# Patient Record
Sex: Male | Born: 2000 | Race: White | Hispanic: No | Marital: Single | Smoking: Never smoker
Health system: Southern US, Community
[De-identification: ages and names within clinical notes are randomized; demographics above are authoritative.]

## PROBLEM LIST (undated history)

## (undated) DIAGNOSIS — N200 Calculus of kidney: Secondary | ICD-10-CM

## (undated) DIAGNOSIS — F419 Anxiety disorder, unspecified: Secondary | ICD-10-CM

## (undated) DIAGNOSIS — F32A Depression, unspecified: Secondary | ICD-10-CM

## (undated) DIAGNOSIS — L409 Psoriasis, unspecified: Secondary | ICD-10-CM

## (undated) HISTORY — DX: Anxiety disorder, unspecified: F41.9

## (undated) HISTORY — DX: Depression, unspecified: F32.A

---

## 2020-01-27 ENCOUNTER — Ambulatory Visit: Payer: Self-pay | Attending: Internal Medicine

## 2020-01-27 DIAGNOSIS — Z23 Encounter for immunization: Secondary | ICD-10-CM

## 2020-01-27 NOTE — Progress Notes (Signed)
   Covid-19 Vaccination Clinic  Name:  Tia Gelb    MRN: [id number] DOB: August 21, 2001  01/27/2020  Mr. Parcell was observed post Covid-19 immunization for 15 minutes without incident. He was provided with Vaccine Information Sheet and instruction to access the V-Safe system.   Mr. Hamman was instructed to call 911 with any severe reactions post vaccine: Marland Kitchen Difficulty breathing  . Swelling of face and throat  . A fast heartbeat  . A bad rash all over body  . Dizziness and weakness   Immunizations Administered    Name Date Dose VIS Date Route   Pfizer COVID-19 Vaccine 01/27/2020  4:39 PM 0.3 mL 10/11/2019 Intramuscular   Manufacturer: [employer], [employer]   Lot: [id number]   NDC: [address]-3

## 2020-02-06 ENCOUNTER — Emergency Department: Payer: 59

## 2020-02-06 ENCOUNTER — Other Ambulatory Visit: Payer: Self-pay | Admitting: Urology

## 2020-02-06 ENCOUNTER — Other Ambulatory Visit: Payer: Self-pay

## 2020-02-06 ENCOUNTER — Encounter
Admission: EM | Disposition: A | Discharge status: Home / Self Care | Payer: Self-pay | Source: Home / Self Care | Attending: Emergency Medicine

## 2020-02-06 ENCOUNTER — Ambulatory Visit (INDEPENDENT_AMBULATORY_CARE_PROVIDER_SITE_OTHER): Payer: Self-pay | Admitting: Urology

## 2020-02-06 ENCOUNTER — Encounter: Payer: Self-pay | Admitting: Emergency Medicine

## 2020-02-06 ENCOUNTER — Observation Stay
Admission: EM | Admit: 2020-02-06 | Discharge: 2020-02-06 | Disposition: A | Discharge status: Home / Self Care | Payer: 59 | Attending: Urology | Admitting: Urology

## 2020-02-06 DIAGNOSIS — Z79899 Other long term (current) drug therapy: Secondary | ICD-10-CM | POA: Diagnosis not present

## 2020-02-06 DIAGNOSIS — Z87442 Personal history of urinary calculi: Secondary | ICD-10-CM | POA: Diagnosis not present

## 2020-02-06 DIAGNOSIS — N135 Crossing vessel and stricture of ureter without hydronephrosis: Secondary | ICD-10-CM

## 2020-02-06 DIAGNOSIS — N201 Calculus of ureter: Secondary | ICD-10-CM | POA: Diagnosis present

## 2020-02-06 DIAGNOSIS — N200 Calculus of kidney: Secondary | ICD-10-CM

## 2020-02-06 DIAGNOSIS — Z20822 Contact with and (suspected) exposure to covid-19: Secondary | ICD-10-CM | POA: Insufficient documentation

## 2020-02-06 DIAGNOSIS — N132 Hydronephrosis with renal and ureteral calculous obstruction: Principal | ICD-10-CM | POA: Insufficient documentation

## 2020-02-06 DIAGNOSIS — Z6828 Body mass index (BMI) 28.0-28.9, adult: Secondary | ICD-10-CM | POA: Diagnosis not present

## 2020-02-06 HISTORY — DX: Calculus of kidney: N20.0

## 2020-02-06 HISTORY — DX: Psoriasis, unspecified: L40.9

## 2020-02-06 HISTORY — PX: EXTRACORPOREAL SHOCK WAVE LITHOTRIPSY: SHX1557

## 2020-02-06 LAB — COMPREHENSIVE METABOLIC PANEL
ALT: 73 U/L — ABNORMAL HIGH (ref 0–44)
AST: 42 U/L — ABNORMAL HIGH (ref 15–41)
Albumin: 4.3 g/dL (ref 3.5–5.0)
Alkaline Phosphatase: 66 U/L (ref 38–126)
Anion gap: 9 (ref 5–15)
BUN: 13 mg/dL (ref 6–20)
CO2: 24 mmol/L (ref 22–32)
Calcium: 9 mg/dL (ref 8.9–10.3)
Chloride: 105 mmol/L (ref 98–111)
Creatinine, Ser: 0.77 mg/dL (ref 0.61–1.24)
GFR calc Af Amer: >60 mL/min (ref >60)
GFR calc non Af Amer: >60 mL/min (ref >60)
Glucose, Bld: 98 mg/dL (ref 70–99)
Potassium: 3.9 mmol/L (ref 3.5–5.1)
Sodium: 138 mmol/L (ref 135–145)
Total Bilirubin: 0.9 mg/dL (ref 0.3–1.2)
Total Protein: 7.5 g/dL (ref 6.5–8.1)

## 2020-02-06 LAB — CBC
HCT: 44.5 % (ref 39.0–52.0)
Hemoglobin: 14.7 g/dL (ref 13.0–17.0)
MCH: 27.8 pg (ref 26.0–34.0)
MCHC: 33 g/dL (ref 30.0–36.0)
MCV: 84.1 fL (ref 80.0–100.0)
Platelets: 173 10*3/uL (ref 150–400)
RBC: 5.29 MIL/uL (ref 4.22–5.81)
RDW: 12.5 % (ref 11.5–15.5)
WBC: 6.1 10*3/uL (ref 4.0–10.5)
nRBC: 0 % (ref 0.0–0.2)

## 2020-02-06 LAB — URINALYSIS, COMPLETE (UACMP) WITH MICROSCOPIC
Bacteria, UA: NONE SEEN
Bilirubin Urine: NEGATIVE
Glucose, UA: NEGATIVE mg/dL
Ketones, ur: NEGATIVE mg/dL
Nitrite: NEGATIVE
Protein, ur: 100 mg/dL — AB
RBC / HPF: >50 RBC/hpf — ABNORMAL HIGH (ref 0–5)
Specific Gravity, Urine: 1.025 (ref 1.005–1.030)
Squamous Epithelial / HPF: NONE SEEN (ref 0–5)
pH: 5 (ref 5.0–8.0)

## 2020-02-06 LAB — RESPIRATORY PANEL BY RT PCR (FLU A&B, COVID)
Influenza A by PCR: NEGATIVE
Influenza B by PCR: NEGATIVE
SARS Coronavirus 2 by RT PCR: NEGATIVE

## 2020-02-06 LAB — LIPASE, BLOOD: Lipase: 21 U/L (ref 11–51)

## 2020-02-06 SURGERY — LITHOTRIPSY, ESWL
Anesthesia: Moderate Sedation | Laterality: Left

## 2020-02-06 MED ORDER — DIPHENHYDRAMINE HCL 25 MG PO CAPS
25.0000 mg | ORAL_CAPSULE | ORAL | Status: AC
  Administered 2020-02-06: 16:00:00 25 mg via ORAL

## 2020-02-06 MED ORDER — ONDANSETRON HCL 4 MG/2ML IJ SOLN
INTRAMUSCULAR | Status: AC
  Administered 2020-02-06: 17:00:00 4 mg via INTRAVENOUS
  Filled 2020-02-06: qty 2

## 2020-02-06 MED ORDER — ONDANSETRON HCL 4 MG/2ML IJ SOLN: 4.0000 mg | Freq: Once | INTRAMUSCULAR | Status: AC

## 2020-02-06 MED ORDER — FENTANYL CITRATE (PF) 100 MCG/2ML IJ SOLN
50.0000 ug | INTRAMUSCULAR | Status: DC | PRN
  Administered 2020-02-06: 12:00:00 50 ug via INTRAVENOUS
  Filled 2020-02-06: qty 2

## 2020-02-06 MED ORDER — CIPROFLOXACIN HCL 500 MG PO TABS
500.0000 mg | ORAL_TABLET | ORAL | Status: AC
  Administered 2020-02-06: 17:00:00 500 mg via ORAL

## 2020-02-06 MED ORDER — TAMSULOSIN HCL 0.4 MG PO CAPS: 0.4000 mg | ORAL_CAPSULE | Freq: Every day | ORAL | 0 refills | Status: AC

## 2020-02-06 MED ORDER — CIPROFLOXACIN HCL 500 MG PO TABS
ORAL_TABLET | ORAL | Status: AC
  Filled 2020-02-06: qty 1

## 2020-02-06 MED ORDER — HYDROMORPHONE HCL 1 MG/ML IJ SOLN
1.0000 mg | Freq: Once | INTRAMUSCULAR | Status: DC
  Filled 2020-02-06: qty 1

## 2020-02-06 MED ORDER — DIPHENHYDRAMINE HCL 25 MG PO CAPS
ORAL_CAPSULE | ORAL | Status: AC
  Filled 2020-02-06: qty 1

## 2020-02-06 MED ORDER — MORPHINE SULFATE (PF) 2 MG/ML IV SOLN
2.0000 mg | Freq: Once | INTRAVENOUS | Status: DC
  Filled 2020-02-06: qty 1

## 2020-02-06 MED ORDER — HYDROCODONE-ACETAMINOPHEN 5-325 MG PO TABS: 1.0000 | ORAL_TABLET | Freq: Four times a day (QID) | ORAL | 0 refills | Status: AC | PRN

## 2020-02-06 MED ORDER — MORPHINE SULFATE (PF) 2 MG/ML IV SOLN
2.0000 mg | Freq: Once | INTRAVENOUS | Status: AC
  Administered 2020-02-06: 2 mg via INTRAVENOUS

## 2020-02-06 MED ORDER — DIAZEPAM 5 MG PO TABS
10.0000 mg | ORAL_TABLET | ORAL | Status: AC
  Administered 2020-02-06: 10 mg via ORAL

## 2020-02-06 MED ORDER — ONDANSETRON HCL 4 MG/2ML IJ SOLN
4.0000 mg | Freq: Once | INTRAMUSCULAR | Status: AC
  Administered 2020-02-06: 4 mg via INTRAVENOUS
  Filled 2020-02-06: qty 2

## 2020-02-06 MED ORDER — SODIUM CHLORIDE 0.9 % IV SOLN
INTRAVENOUS | Status: DC
  Administered 2020-02-06: 100 mL/h via INTRAVENOUS

## 2020-02-06 MED ORDER — KETOROLAC TROMETHAMINE 30 MG/ML IJ SOLN
30.0000 mg | Freq: Once | INTRAMUSCULAR | Status: AC
  Administered 2020-02-06: 15:00:00 30 mg via INTRAVENOUS
  Filled 2020-02-06: qty 1

## 2020-02-06 MED ORDER — HYDROMORPHONE HCL 1 MG/ML IJ SOLN
INTRAMUSCULAR | Status: AC
  Administered 2020-02-06: 16:00:00 1 mg via INTRAVENOUS
  Filled 2020-02-06: qty 1

## 2020-02-06 MED ORDER — DIAZEPAM 5 MG PO TABS
ORAL_TABLET | ORAL | Status: AC
  Filled 2020-02-06: qty 2

## 2020-02-06 MED ORDER — MORPHINE SULFATE (PF) 2 MG/ML IV SOLN
2.0000 mg | Freq: Once | INTRAVENOUS | Status: AC
  Administered 2020-02-06: 14:00:00 2 mg via INTRAVENOUS
  Filled 2020-02-06: qty 1

## 2020-02-06 NOTE — ED Notes (Signed)
Up to bathroom  States he is still having pain but it has eased off slightly

## 2020-02-06 NOTE — ED Notes (Signed)
Report called to Cindy RN.

## 2020-02-06 NOTE — ED Triage Notes (Signed)
Pt here for left side/mid abdominal pain. Hx of kidney stone, feel similar.  Tried to call doctor back in [place] to get flomax but hasn't heard back.  No vomiting. No fever.

## 2020-02-06 NOTE — ED Provider Notes (Signed)
[facility] Emergency Department Provider Note  ____________________________________________  Time seen: Approximately 11:58 AM  I have reviewed the triage vital signs and the nursing notes.   HISTORY  Chief Complaint Abdominal Pain    HPI Ronald Little is a 19 y.o. male that presents to the emergency department for evaluation of left-sided mid back pain today.  Pain radiates into his left side.  States that he had his first kidney stone when he was 77.  He is concerned that he is having a "attack." His mother called urology this morning, who scheduled him an appointment for 130 but said it was okay if he came to the emergency department.  He has a urologist in [place] and called them this morning as well but they have not returned his call.  No fevers, nausea, vomiting, dysuria.  Past Medical History:  Diagnosis Date  . Kidney stone   . Psoriasis     There are no problems to display for this patient.   History reviewed. No pertinent surgical history.  Prior to Admission medications   Not on File    Allergies Patient has no known allergies.  History reviewed. No pertinent family history.  Social History Social History   Tobacco Use  . Smoking status: Never Smoker  . Smokeless tobacco: Never Used  Substance Use Topics  . Alcohol use: Never  . Drug use: Yes    Types: Marijuana     Review of Systems  Constitutional: No fever/chills Cardiovascular: No chest pain. Respiratory: No cough. No SOB. Gastrointestinal: No abdominal pain.  No nausea, no vomiting.  Genitourinary: Negative for dysuria. Musculoskeletal: Positive for left-sided back pain. Skin: Negative for rash, abrasions, lacerations, ecchymosis. Neurological: Negative for headaches   ____________________________________________   PHYSICAL EXAM:  VITAL SIGNS: ED Triage Vitals  Enc Vitals Group     BP 02/06/20 0939 (!) 158/95     Pulse Rate 02/06/20 0939 80     Resp 02/06/20  0939 16     Temp 02/06/20 0939 98.2 F (36.8 C)     Temp Source 02/06/20 0939 Oral     SpO2 02/06/20 0939 99 %     Weight 02/06/20 0940 225 lb (102.1 kg)     Height 02/06/20 0940 6\' 2"  (1.88 m)     Head Circumference --      Peak Flow --      Pain Score 02/06/20 0940 5     Pain Loc --      Pain Edu? --      Excl. in [place]? --      Constitutional: Alert and oriented. Well appearing and in no acute distress. Eyes: Conjunctivae are normal. PERRL. EOMI. Head: Atraumatic. ENT:      Ears:      Nose: No congestion/rhinnorhea.      Mouth/Throat: Mucous membranes are moist.  Neck: No stridor. Cardiovascular: Normal rate, regular rhythm.  Good peripheral circulation. Respiratory: Normal respiratory effort without tachypnea or retractions. Lungs CTAB. Good air entry to the bases with no decreased or absent breath sounds. Gastrointestinal: Bowel sounds 4 quadrants. Soft and nontender to palpation. No guarding or rigidity. No palpable masses. No distention. Musculoskeletal: Full range of motion to all extremities. No gross deformities appreciated. Neurologic:  Normal speech and language. No gross focal neurologic deficits are appreciated.  Skin:  Skin is warm, dry and intact. No rash noted. Psychiatric: Mood and affect are normal. Speech and behavior are normal. Patient exhibits appropriate insight and judgement.  ____________________________________________   LABS (all labs ordered are listed, but only abnormal results are displayed)  Labs Reviewed  COMPREHENSIVE METABOLIC PANEL - Abnormal; Notable for the following components:      Result Value   AST 42 (*)    ALT 73 (*)    All other components within normal limits  URINALYSIS, COMPLETE (UACMP) WITH MICROSCOPIC - Abnormal; Notable for the following components:   Color, Urine YELLOW (*)    APPearance HAZY (*)    Hgb urine dipstick LARGE (*)    Protein, ur 100 (*)    Leukocytes,Ua TRACE (*)    RBC / HPF >50 (*)    All other  components within normal limits  RESPIRATORY PANEL BY RT PCR (FLU A&B, COVID)  LIPASE, BLOOD  CBC   ____________________________________________  EKG   ____________________________________________  RADIOLOGY   CT Renal Stone Study  Result Date: 02/06/2020 CLINICAL DATA:  Left flank pain for 2 days. EXAM: CT ABDOMEN AND PELVIS WITHOUT CONTRAST TECHNIQUE: Multidetector CT imaging of the abdomen and pelvis was performed following the standard protocol without IV contrast. COMPARISON:  None. FINDINGS: Lower chest: Insert lung bases Hepatobiliary: No focal hepatic lesions or intrahepatic biliary dilatation. The gallbladder is normal. No common bile duct dilatation. Pancreas: No mass, inflammation or ductal dilatation. Spleen: Normal size. No focal lesions. Adrenals/Urinary Tract: The adrenal glands are normal. Small left renal calculi are noted. I do not see any definite right-sided renal calculi. There is moderate to marked left-sided hydronephrosis and marked left hydroureter all the way down to an obstructing 11.5 x 8.0 mm distal ureteral calculus near the UVJ. No right-sided ureteral calculi. No bladder calculi. No worrisome renal or bladder lesions are identified without contrast. Stomach/Bowel: The stomach, duodenum, small bowel and colon are grossly normal without oral contrast. No inflammatory changes, mass lesions or obstructive findings. The appendix is normal. Vascular/Lymphatic: The aorta is normal in caliber. No atheroscerlotic calcifications. No mesenteric of retroperitoneal mass or adenopathy. Small scattered lymph nodes are noted. Reproductive: The prostate gland and seminal vesicles are unremarkable. Other: No pelvic mass or adenopathy. No free pelvic fluid collections. No inguinal mass or adenopathy. No abdominal wall hernia or subcutaneous lesions. Musculoskeletal: No significant bony findings. IMPRESSION: 1. 11.5 x 8.0 mm distal left ureteral calculus causing moderate to marked  left-sided hydronephrosis and marked left hydroureter. 2. Small left renal calculi. 3. No worrisome renal or bladder lesions without contrast. 4. No other significant or acute abdominal/pelvic findings. Electronically Signed   By: Rudie Meyer M.D.   On: 02/06/2020 12:24    ____________________________________________    PROCEDURES  Procedure(s) performed:    Procedures    Medications  fentaNYL (SUBLIMAZE) injection 50 mcg (50 mcg Intravenous Given 02/06/20 1153)  ondansetron (ZOFRAN) injection 4 mg (4 mg Intravenous Given 02/06/20 1153)  morphine 2 MG/ML injection 2 mg (2 mg Intravenous Given 02/06/20 1223)  morphine 2 MG/ML injection 2 mg (2 mg Intravenous Given 02/06/20 1402)  ketorolac (TORADOL) 30 MG/ML injection 30 mg (30 mg Intravenous Given 02/06/20 1500)     ____________________________________________   INITIAL IMPRESSION / ASSESSMENT AND PLAN / ED COURSE  Pertinent labs & imaging results that were available during my care of the patient were reviewed by me and considered in my medical decision making (see chart for details).  Review of the  CSRS was performed in accordance of the [facility] prior to dispensing any controlled drugs.   Patient's diagnosis is consistent with nephrolithiasis.  No leukocytosis on CBC.  Urinalysis shows hemoglobin, leukocytes, red blood cells, white blood cells, yeast. ----------------------------------------- 12:45 PM on 02/06/2020 -----------------------------------------  CT scan shows 11.5 x 8.0 mm distal left ureteral calculus with moderate to marked left-sided hydronephrosis and marked left hydroureter. Dr. Apolinar Junes with urology was paged and consulted concerning CT scan results.  She recommends evaluation by urology for possible lithotripsy.  Patient was updated. Pain has improved with morphine.  ----------------------------------------- 2:00  PM on 02/06/2020 -----------------------------------------  Urology has been in to evaluate the  patient.  Plan is for lithotripsy today.  Patient transported for lithotripsy.   Ronald Little was evaluated in Emergency Department on 02/06/2020 for the symptoms described in the history of present illness. He was evaluated in the context of the global COVID-19 pandemic, which necessitated consideration that the patient might be at risk for infection with the SARS-CoV-2 virus that causes COVID-19. Institutional protocols and algorithms that pertain to the evaluation of patients at risk for COVID-19 are in a state of rapid change based on information released by regulatory bodies including the CDC and federal and state organizations. These policies and algorithms were followed during the patient's care in the ED.   ____________________________________________  FINAL CLINICAL IMPRESSION(S) / ED DIAGNOSES  Final diagnoses:  Nephrolithiasis      NEW MEDICATIONS STARTED DURING THIS VISIT:  ED Discharge Orders    None          This chart was dictated using voice recognition software/Dragon. Despite best efforts to proofread, errors can occur which can change the meaning. Any change was purely unintentional.    Enid Derry, PA-C 02/06/20 1549    Sharman Cheek, MD 02/13/20 [phone]

## 2020-02-06 NOTE — Discharge Instructions (Signed)
See [facility] discharge instructions in chart.  AMBULATORY SURGERY  DISCHARGE INSTRUCTIONS   1) The drugs that you were given will stay in your system until tomorrow so for the next 24 hours you should not:  A) Drive an automobile B) Make any legal decisions C) Drink any alcoholic beverage   2) You may resume regular meals tomorrow.  Today it is better to start with liquids and gradually work up to solid foods.  You may eat anything you prefer, but it is better to start with liquids, then soup and crackers, and gradually work up to solid foods.   3) Please notify your doctor immediately if you have any unusual bleeding, trouble breathing, redness and pain at the surgery site, drainage, fever, or pain not relieved by medication.    4) Additional Instructions:        Please contact your physician with any problems or Same Day Surgery at [phone], Monday through Friday 6 am to 4 pm, or [place] [facility] [place] [facility] number at [phone].  

## 2020-02-06 NOTE — H&P (Signed)
02/06/2020  2:53 PM   Tammi Sou [birth year] [id number]  Referring provider: No referring provider defined for this encounter.  Chief Complaint  Patient presents with  . Abdominal Pain    HPI: Mr. Bernasconi is a 19 year old male with a history of nephrolithiasis who presented to the ED and was found to have a 11 mm left UVJ stone.  He presented to the emergency room today with a complaint of left-sided mid back pain. His pain was radiating to his left side and he was fearful he was about to have another stone attack.  The pain has substantially increased since his time in the ED. It is now located in the left lower quadrant radiating to the left waist. He is having 10 out of 10 pain despite 50 mg of fentanyl and 4 mg of morphine.  Patient denies any modifying or aggravating factors.  Patient denies any gross hematuria or dysuria.  Patient denies any fevers, chills, nausea or vomiting.   He states he has been having issues with stones since age of 57. He states his last known imaging was at the age of 1 and he believes at that time he had a 3 mm stone in his left kidney.  CT Renal stone study today demonstrated 11.5 x 8.0 mm distal left ureteral calculus causing moderate to marked left-sided hydronephrosis and marked left hydroureter.  PMH: Past Medical History:  Diagnosis Date  . Kidney stone   . Psoriasis     Surgical History: History reviewed. No pertinent surgical history.  Wisdom tooth extraction Gynecomastia reduction  Home Medications:  Abilify  Allergies: No Known Allergies  Family History: History reviewed. No pertinent family history.  Social History:  reports that he has never smoked. He has never used smokeless tobacco. He reports current drug use. Drug: Marijuana. He reports that he does not drink alcohol.  ROS: Pertinent ROS in HPI  Physical Exam: BP (!) 148/90 (BP Location: Right Arm)   Pulse 72   Temp 98.2 F (36.8 C) (Oral)   Resp 18   Ht 6\' 2"   (1.88 m)   Wt 102.1 kg   SpO2 99%   BMI 28.89 kg/m   Constitutional:  Well nourished. Alert and oriented, writhing in pain.  HEENT: George AT, mask in place.  Trachea midline, no masses. Cardiovascular: No clubbing, cyanosis, or edema. Respiratory: Normal respiratory effort, no increased work of breathing. GI: Abdomen is soft, non tender, non distended, no abdominal masses. Liver and spleen not palpable.   GU: Left CVA tenderness.  No bladder fullness or masses.   Neurologic: Grossly intact, no focal deficits, moving all 4 extremities. Psychiatric: Normal mood and affect.  Laboratory Data: Lab Results  Component Value Date   WBC 6.1 02/06/2020   HGB 14.7 02/06/2020   HCT 44.5 02/06/2020   MCV 84.1 02/06/2020   PLT 173 02/06/2020    Lab Results  Component Value Date   CREATININE 0.77 02/06/2020    Lab Results  Component Value Date   AST 42 (H) 02/06/2020   Lab Results  Component Value Date   ALT 73 (H) 02/06/2020    Urinalysis    Component Value Date/Time   COLORURINE YELLOW (A) 02/06/2020 0943   APPEARANCEUR HAZY (A) 02/06/2020 0943   LABSPEC 1.025 02/06/2020 0943   PHURINE 5.0 02/06/2020 0943   GLUCOSEU NEGATIVE 02/06/2020 0943   HGBUR LARGE (A) 02/06/2020 0943   BILIRUBINUR NEGATIVE 02/06/2020 0943   KETONESUR NEGATIVE 02/06/2020 04/07/2020  PROTEINUR 100 (A) 02/06/2020 0943   NITRITE NEGATIVE 02/06/2020 0943   LEUKOCYTESUR TRACE (A) 02/06/2020 0943    I have reviewed the labs.   Pertinent Imaging: CLINICAL DATA:  Left flank pain for 2 days.  EXAM: CT ABDOMEN AND PELVIS WITHOUT CONTRAST  TECHNIQUE: Multidetector CT imaging of the abdomen and pelvis was performed following the standard protocol without IV contrast.  COMPARISON:  None.  FINDINGS: Lower chest: Insert lung bases  Hepatobiliary: No focal hepatic lesions or intrahepatic biliary dilatation. The gallbladder is normal. No common bile duct dilatation.  Pancreas: No mass, inflammation  or ductal dilatation.  Spleen: Normal size. No focal lesions.  Adrenals/Urinary Tract: The adrenal glands are normal.  Small left renal calculi are noted. I do not see any definite right-sided renal calculi. There is moderate to marked left-sided hydronephrosis and marked left hydroureter all the way down to an obstructing 11.5 x 8.0 mm distal ureteral calculus near the UVJ. No right-sided ureteral calculi. No bladder calculi.  No worrisome renal or bladder lesions are identified without contrast.  Stomach/Bowel: The stomach, duodenum, small bowel and colon are grossly normal without oral contrast. No inflammatory changes, mass lesions or obstructive findings. The appendix is normal.  Vascular/Lymphatic: The aorta is normal in caliber. No atheroscerlotic calcifications. No mesenteric of retroperitoneal mass or adenopathy. Small scattered lymph nodes are noted.  Reproductive: The prostate gland and seminal vesicles are unremarkable.  Other: No pelvic mass or adenopathy. No free pelvic fluid collections. No inguinal mass or adenopathy. No abdominal wall hernia or subcutaneous lesions.  Musculoskeletal: No significant bony findings.  IMPRESSION: 1. 11.5 x 8.0 mm distal left ureteral calculus causing moderate to marked left-sided hydronephrosis and marked left hydroureter. 2. Small left renal calculi. 3. No worrisome renal or bladder lesions without contrast. 4. No other significant or acute abdominal/pelvic findings.   Electronically Signed   By: Rudie Meyer M.D.   On: 02/06/2020 12:24 I have independently reviewed the films.  11 mm left UVJ stone with upstream hydronephrosis.  999 HU and 14SSD.     Assessment & Plan:   1. Left ureteral stone Explained to the patient that with ESWL, shock waves are focused on the stone using X-rays to pinpoint the stone.  The shock waves are fired repeatedly which usually causes the stone to break into small pieces which  pass out in the urine over the next few weeks.  They will go home that day and may be able to resume normal activities in three days.  They should be given a strainer and they need to collect any fragments/sediments that they pass for analysis.   Risks involved with the procedure consist of bruising of the skin and kidney, possible long-term kidney damage, development of HTN, damage to the bowel, spleen, liver, pancreas, male organs or lungs, hematuria, hematuria serious enough to require transfusion or surgical repair, formation of a hematoma, infection or sepsis.  If the stone is too large or dense, it may not break apart or break apart in large pieces and cause "Steinstrasse."  If this happens, it would result in the need for another procedure (likely URS/LL/stent placement).  IV sedation is typically used, but in rare instances general anesthesia is used with the risk of irregular heart beat, irregular BP, stroke, MI, CVA, paralysis, coma and/or death.  2. Left hydronephrosis Obtain RUS to ensure the hydronephrosis has resolved once they have passed and/or recovered from procedure to ensure to iatrogenic hydronephrosis remains - it is explained  to the patient that it is important to document resolution of the hydronephrosis as "silent hydronephrosis" can occur and cause damage and/or loss of the kidney   Patient is scheduled for Emergent Left ESWL with Dr. Erlene Quan this afternoon.  These notes generated with voice recognition software. I apologize for typographical errors.  Zara Council, PA-C  [facility] [address]  [place], [place] [address] [phone]

## 2020-02-06 NOTE — ED Notes (Signed)
See triage note  Presents with left flank pain   Hx of kidney stones in the past  States feels the same

## 2020-02-17 ENCOUNTER — Ambulatory Visit: Payer: 59 | Attending: Internal Medicine

## 2020-02-17 DIAGNOSIS — Z23 Encounter for immunization: Secondary | ICD-10-CM

## 2020-02-17 NOTE — Progress Notes (Signed)
   Covid-19 Vaccination Clinic  Name:  Ronald Little    MRN: [id number] DOB: 08-14-2001  02/17/2020  [name] was observed post Covid-19 immunization for 15 minutes without incident. He was provided with Vaccine Information Sheet and instruction to access the V-Safe system.   [name] was instructed to call 911 with any severe reactions post vaccine: Marland Kitchen Difficulty breathing  . Swelling of face and throat  . A fast heartbeat  . A bad rash all over body  . Dizziness and weakness   Immunizations Administered    Name Date Dose VIS Date Route   Pfizer COVID-19 Vaccine 02/17/2020  4:08 PM 0.3 mL 12/25/2018 Intramuscular   Manufacturer: [employer], [employer]   Lot: [id number]   NDC: [address]-5

## 2020-02-25 ENCOUNTER — Ambulatory Visit (INDEPENDENT_AMBULATORY_CARE_PROVIDER_SITE_OTHER): Payer: 59 | Admitting: Physician Assistant

## 2020-02-25 ENCOUNTER — Other Ambulatory Visit: Payer: Self-pay

## 2020-02-25 ENCOUNTER — Encounter: Payer: Self-pay | Admitting: Physician Assistant

## 2020-02-25 ENCOUNTER — Ambulatory Visit
Admission: RE | Admit: 2020-02-25 | Discharge: 2020-02-25 | Disposition: A | Discharge status: Home / Self Care | Payer: 59 | Source: Ambulatory Visit | Attending: Urology | Admitting: Urology

## 2020-02-25 VITALS — BP 138/79 | HR 80 | Ht 74.0 in | Wt 220.0 lb

## 2020-02-25 DIAGNOSIS — N201 Calculus of ureter: Secondary | ICD-10-CM | POA: Diagnosis not present

## 2020-02-25 NOTE — Progress Notes (Signed)
02/25/2020 10:28 AM   [name] [birth year] [id number]  CC: Postop ESWL  HPI: Ronald Little is a 19 y.o. male who presents for postoperative evaluation s/p ESWL with Dr. Apolinar Junes on 02/06/2019 for management of an 11 mm left UVJ stone.  Today, he reports resolution of left flank pain and gross hematuria following ESWL. He has been straining his urine and captured numerous fragments, most recently 3 days ago, which he brings with him today for analysis.   Patient reports his first kidney stone episode at the age of 52. He recalls was a calcium oxalate stone. He drinks >100 ounces of water daily. He has never undergone metabolic analysis for nephrolithiasis. He would be interested in pursuing this, however he intends to return home to [facility] next month for the summer and will have to consider appropriate timing for this around his final exam schedule and his travels.  In comparison with prior CT stone study dated 02/06/2020, he is believed to have some residual small nonobstructing left renal calculi.  Follow-up KUB today revealed resolution of the left UVJ stone.  PMH: Past Medical History:  Diagnosis Date  . Kidney stone   . Psoriasis     Surgical History: Past Surgical History:  Procedure Laterality Date  . EXTRACORPOREAL SHOCK WAVE LITHOTRIPSY Left 02/06/2020   Procedure: EXTRACORPOREAL SHOCK WAVE LITHOTRIPSY (ESWL);  Surgeon: Vanna Scotland, MD;  Location: [facility] ORS;  Service: Urology;  Laterality: Left;    Home Medications:  Allergies as of 02/25/2020   No Known Allergies     Medication List       Accurate as of February 25, 2020 10:28 AM. If you have any questions, ask your nurse or doctor.        HYDROcodone-acetaminophen 5-325 MG tablet Commonly known as: NORCO/VICODIN Take 1-2 tablets by mouth every 6 (six) hours as needed for moderate pain.   tamsulosin 0.4 MG Caps capsule Commonly known as: Flomax Take 1 capsule (0.4 mg total) by mouth daily.        Allergies: No Known Allergies  Family History: No family history on file.  Social History:  reports that he has never smoked. He has never used smokeless tobacco. He reports current drug use. Drug: Marijuana. He reports that he does not drink alcohol.  Physical Exam: BP 138/79   Pulse 80   Ht 6\' 2"  (1.88 m)   Wt 220 lb (99.8 kg)   BMI 28.25 kg/m   Constitutional:  Alert and oriented, No acute distress. HEENT: [place] AT, moist mucus membranes.  Trachea midline, no masses. Cardiovascular: No clubbing, cyanosis, or edema. Respiratory: Normal respiratory effort, no increased work of breathing. Skin: No rashes, bruises or suspicious lesions. Neurologic: Grossly intact, no focal deficits, moving all 4 extremities. Psychiatric: Normal mood and affect.  Pertinent Imaging: KUB, 02/25/2020: CLINICAL DATA:  19 year old male status post lithotripsy on 02/06/2020.  EXAM: ABDOMEN - 1 VIEW  COMPARISON:  CT Abdomen and Pelvis and abdominal radiographs 02/06/2020.  FINDINGS: The bulky 14 mm distal left ureteral calculus does not persist. Punctate up to 4 mm left nephrolithiasis seen by CT is faintly visible today at the left kidney. The right renal shadow is largely obscured by bowel. Normal abdominal and pelvic visceral contours elsewhere. Normal visible bowel gas pattern. No acute osseous abnormality identified.  IMPRESSION: Resolved large distal left ureteral calculus since 02/06/2020. Additional left nephrolithiasis faintly visible by x-ray.   Electronically Signed   By: 04/07/2020 M.D.   On: 02/25/2020  15:12  I personally reviewed the images referenced above and note resolution of the distal left ureteral stone as well as persistence of minimally visible left nephrolithiasis.  Assessment & Plan:   1. Calculus of ureterovesical junction (UVJ) 19 year old male s/p ESWL for management of a large left UVJ stone with interval clearance of numerous stone fragments and  resolution of stone on follow-up KUB today.  Sending stone fragments for analysis. Based on patient's age and history of recurrent nephrolithiasis, I strongly encouraged him to undergo metabolic work-up for further investigation at this time. He is in agreement with this plan and will contact us with his at desired timing for this with his upcoming final exams and anticipated return home over the summer.  I counseled the patient on general stone prevention techniques including maintaining increased water intake, increased citric acid intake, avoidance of high oxalate-containing foods, and decreasing salt intake. Written and verbal information about dietary recommendations given today.   Return for Patient to contact us with desired timing for metabolic work-up.   I spent 32 minutes on the day of the encounter to include pre-visit record review, face-to-face time with the patient, and post-visit ordering of tests.   Debroah Loop, PA-C  [facility] [address], [place], [place] [address] [phone]

## 2020-02-27 ENCOUNTER — Other Ambulatory Visit: Payer: Self-pay | Admitting: Urology

## 2020-02-28 NOTE — Progress Notes (Signed)
Mailed copy of Stone report per Dr. Delana Meyer request.

## 2020-03-02 ENCOUNTER — Telehealth: Payer: Self-pay

## 2020-03-02 NOTE — Telephone Encounter (Signed)
Patient called stating he was advised to give information on which provider he will be seeing back at home for his kidney stones.Marland Kitchen  He will be seeing  Dr. April Holding phone - [phone] Email- [email or web address] Located in [place]

## 2020-03-06 NOTE — Telephone Encounter (Signed)
Patient called wanted his information sent to his urologist back home. Advised he will need to sign a release then we can send information. Stated he will sign release on 03/09/20-aware form will be ready up front for him to sign. Voiced understanding.

## 2021-03-21 IMAGING — CT CT RENAL STONE PROTOCOL
2 of 4 series · 16 of 46 positions shown, 18 images · non-contrast
Comparison: None.

CLINICAL DATA: Left flank pain for 2 days.

EXAM:
CT ABDOMEN AND PELVIS WITHOUT CONTRAST
TECHNIQUE: Multidetector CT imaging of the abdomen and pelvis was performed
following the standard protocol without IV contrast.

[Series 2: stone full standard · axial · 0.70mm/px · z∈[-644,-164]mm · 13 of 106 slices shown, 15 images]
[im 5/106  soft-tissue]
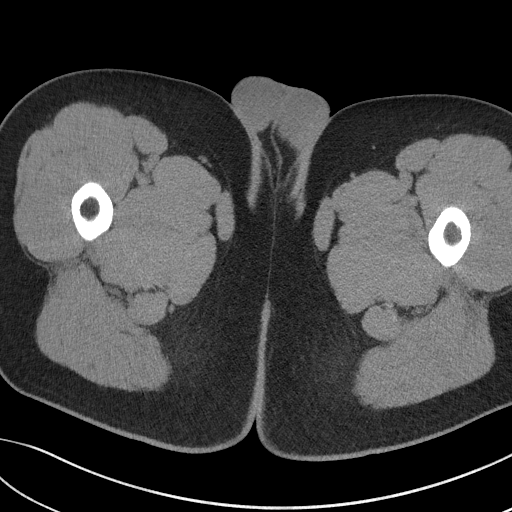
[im 5/106  bone]
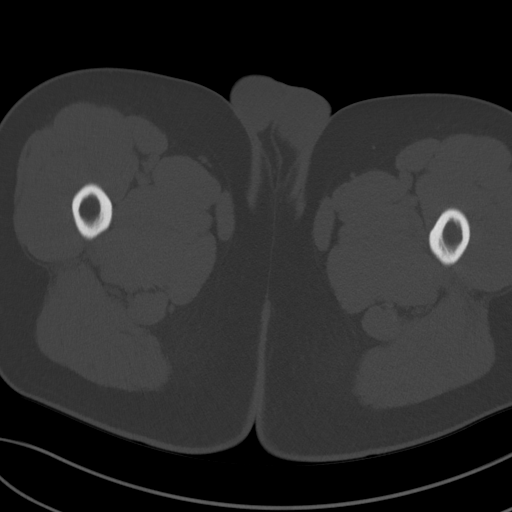
[im 13/106  soft-tissue]
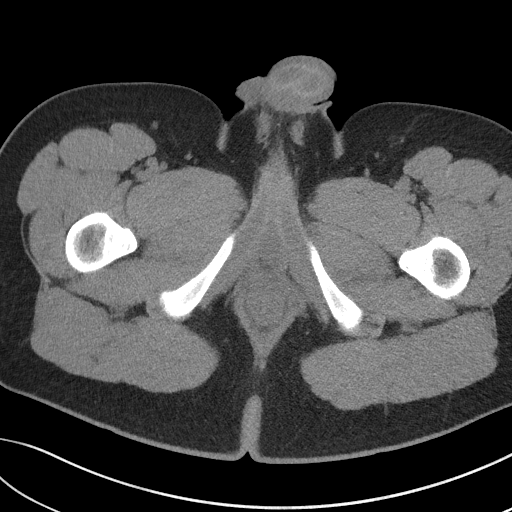
[im 22/106  soft-tissue]
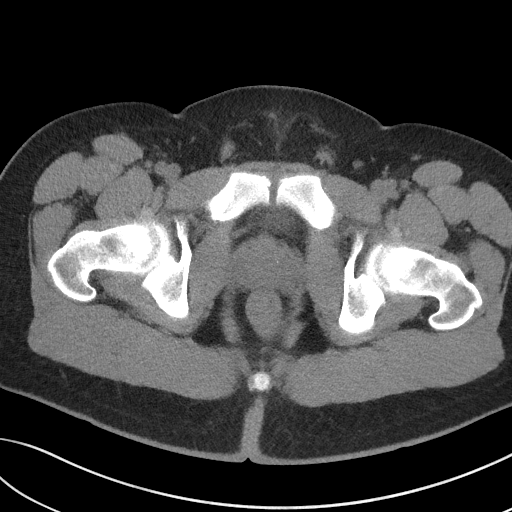
[im 30/106  soft-tissue]
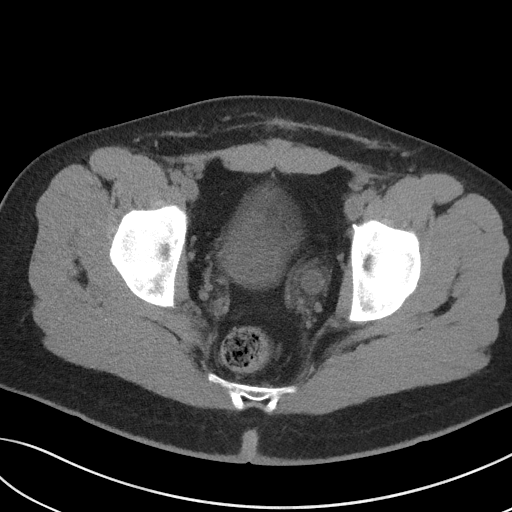
[im 38/106  soft-tissue]
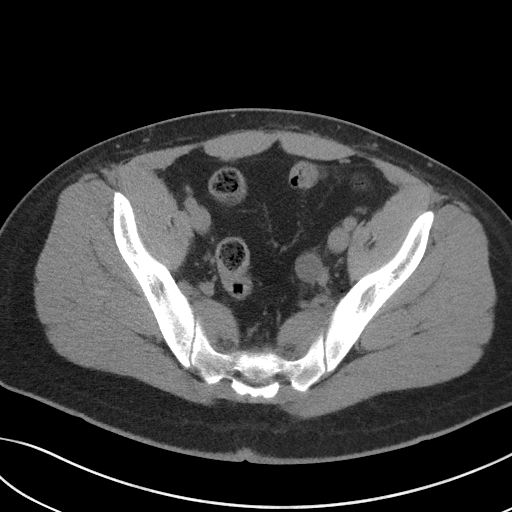
[im 47/106  soft-tissue]
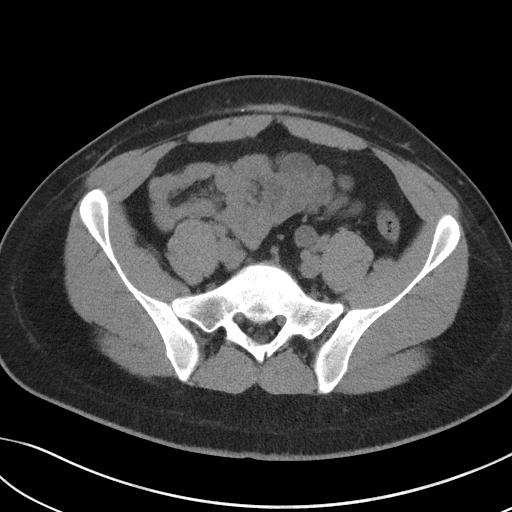
[im 55/106  soft-tissue]
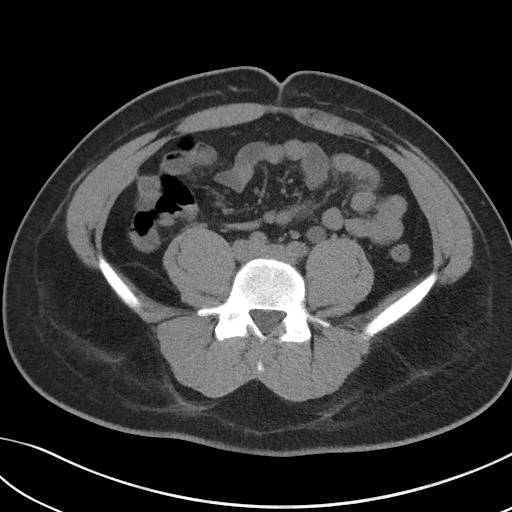
[im 59/106  soft-tissue]
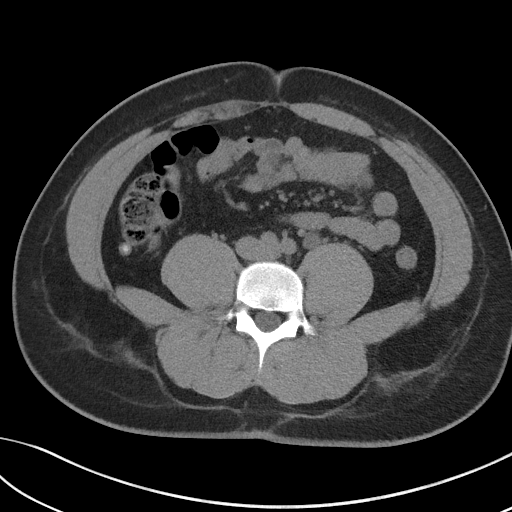
[im 68/106  soft-tissue]
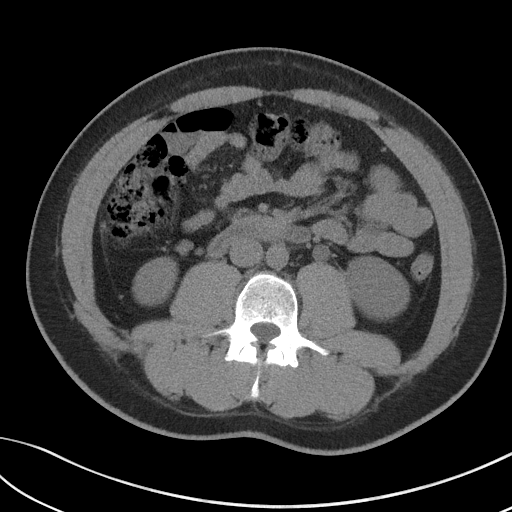
[im 68/106  bone]
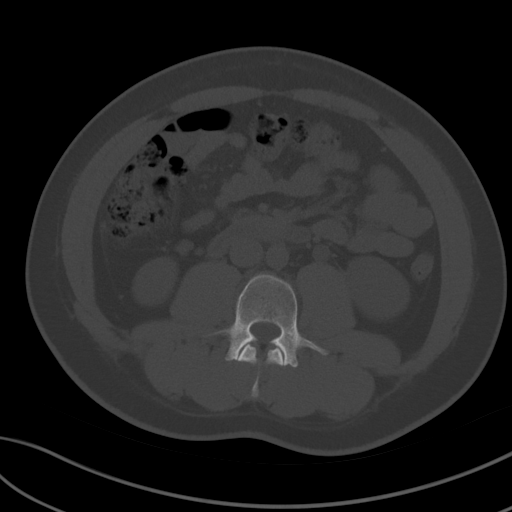
[im 76/106  soft-tissue]
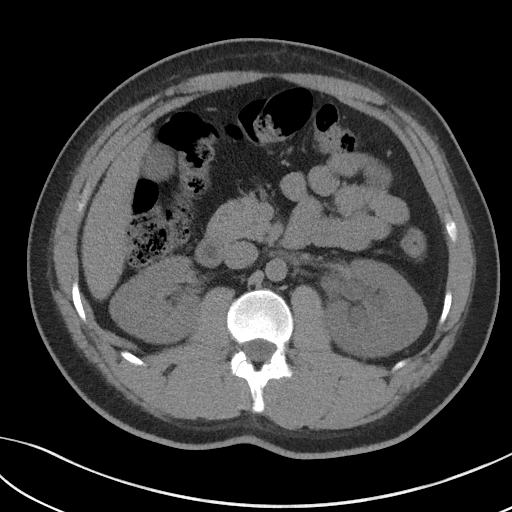
[im 85/106  soft-tissue]
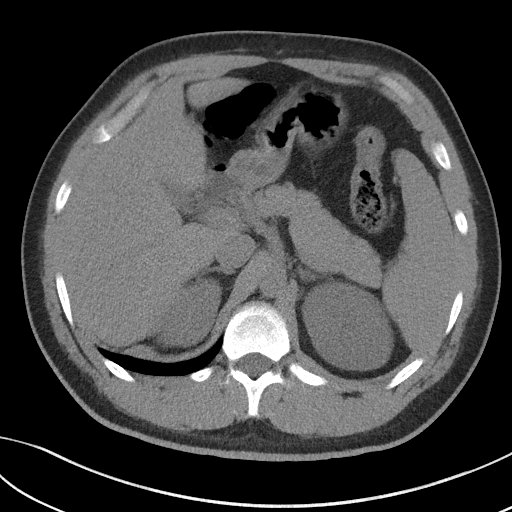
[im 93/106  soft-tissue]
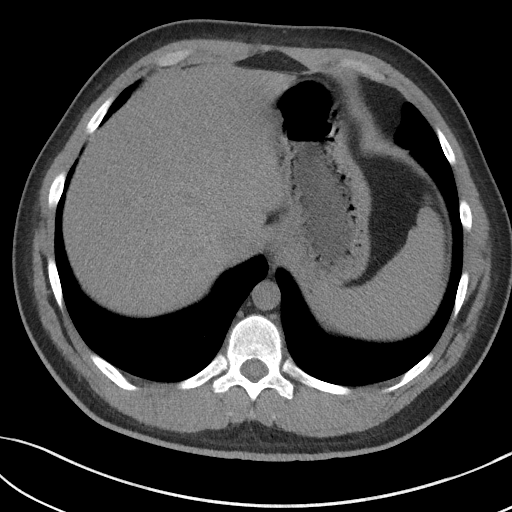
[im 101/106  soft-tissue]
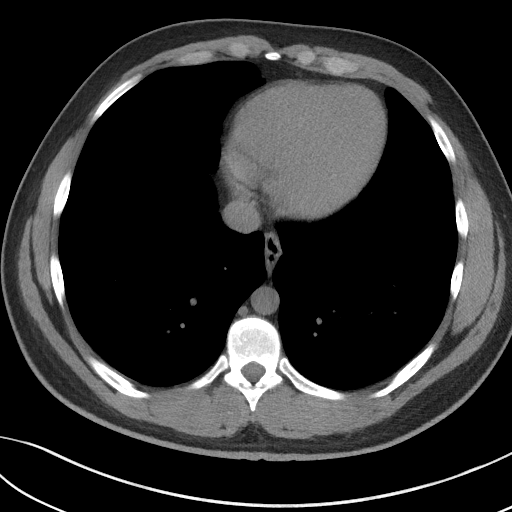

[Series 6: coronal · coronal · 0.84mm/px · 3 of 158 slices shown]
[im 53/158  soft-tissue]
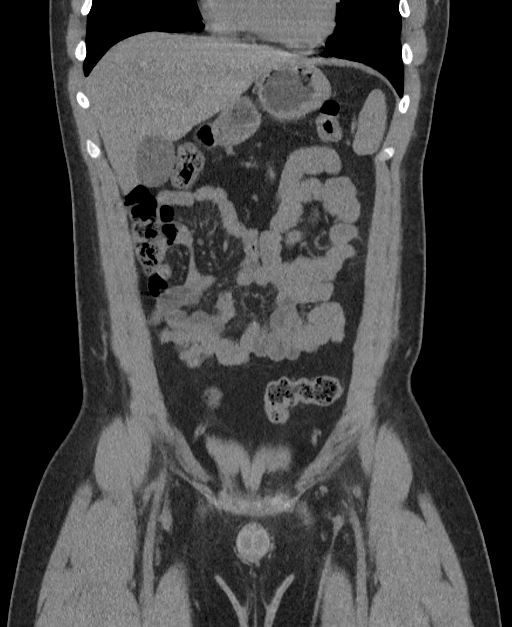
[im 70/158  soft-tissue]
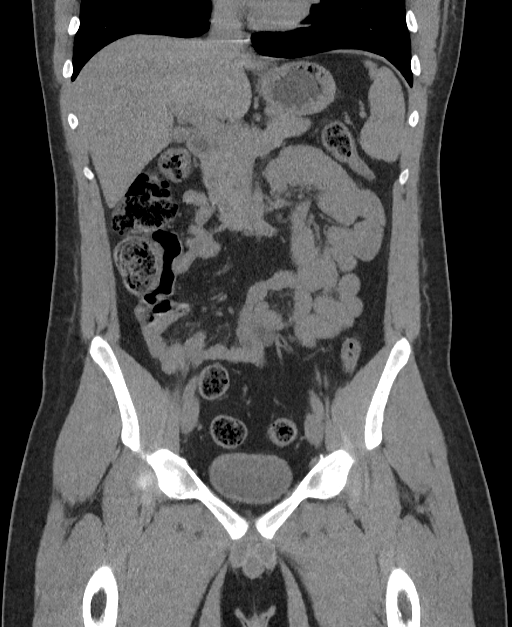
[im 88/158  soft-tissue]
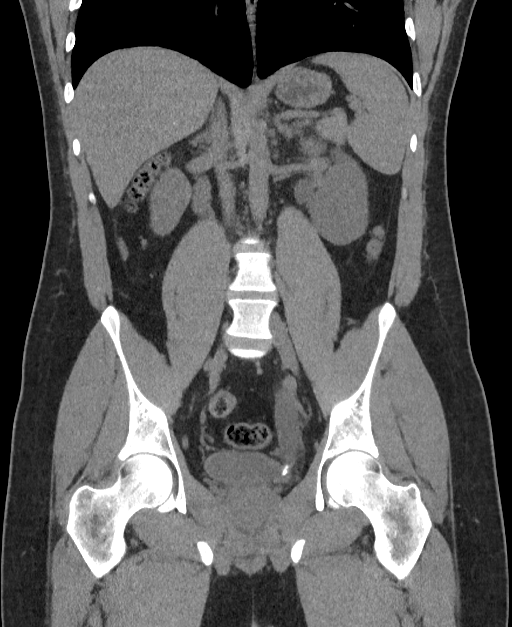

[16 of 46 positions shown; findings below may reference images not displayed]

FINDINGS: Lower chest: Insert lung bases

Hepatobiliary: No focal hepatic lesions or intrahepatic biliary
dilatation. The gallbladder is normal. No common bile duct
dilatation.

Pancreas: No mass, inflammation or ductal dilatation.

Spleen: Normal size. No focal lesions.

Adrenals/Urinary Tract: The adrenal glands are normal.

Small left renal calculi are noted. I do not see any definite
right-sided renal calculi. There is moderate to marked left-sided
hydronephrosis and marked left hydroureter all the way down to an
obstructing 11.5 x 8.0 mm distal ureteral calculus near the UVJ. No
right-sided ureteral calculi. No bladder calculi.

No worrisome renal or bladder lesions are identified without
contrast.

Stomach/Bowel: The stomach, duodenum, small bowel and colon are
grossly normal without oral contrast. No inflammatory changes, mass
lesions or obstructive findings. The appendix is normal.

Vascular/Lymphatic: The aorta is normal in caliber. No
atheroscerlotic calcifications. No mesenteric of retroperitoneal
mass or adenopathy. Small scattered lymph nodes are noted.

Reproductive: The prostate gland and seminal vesicles are
unremarkable.

Other: No pelvic mass or adenopathy. No free pelvic fluid
collections. No inguinal mass or adenopathy. No abdominal wall
hernia or subcutaneous lesions.

Musculoskeletal: No significant bony findings.
IMPRESSION: 1. 11.5 x 8.0 mm distal left ureteral calculus causing moderate to
marked left-sided hydronephrosis and marked left hydroureter.
2. Small left renal calculi.
3. No worrisome renal or bladder lesions without contrast.
4. No other significant or acute abdominal/pelvic findings.

## 2021-04-09 IMAGING — CR DG ABDOMEN 1V
1 series · 1 of 1 positions shown · non-contrast
Comparison: CT Abdomen and Pelvis and abdominal radiographs
02/06/2020.

CLINICAL DATA: 19-year-old male status post lithotripsy on
02/06/2020.

EXAM:
ABDOMEN - 1 VIEW

[abdomen kub]
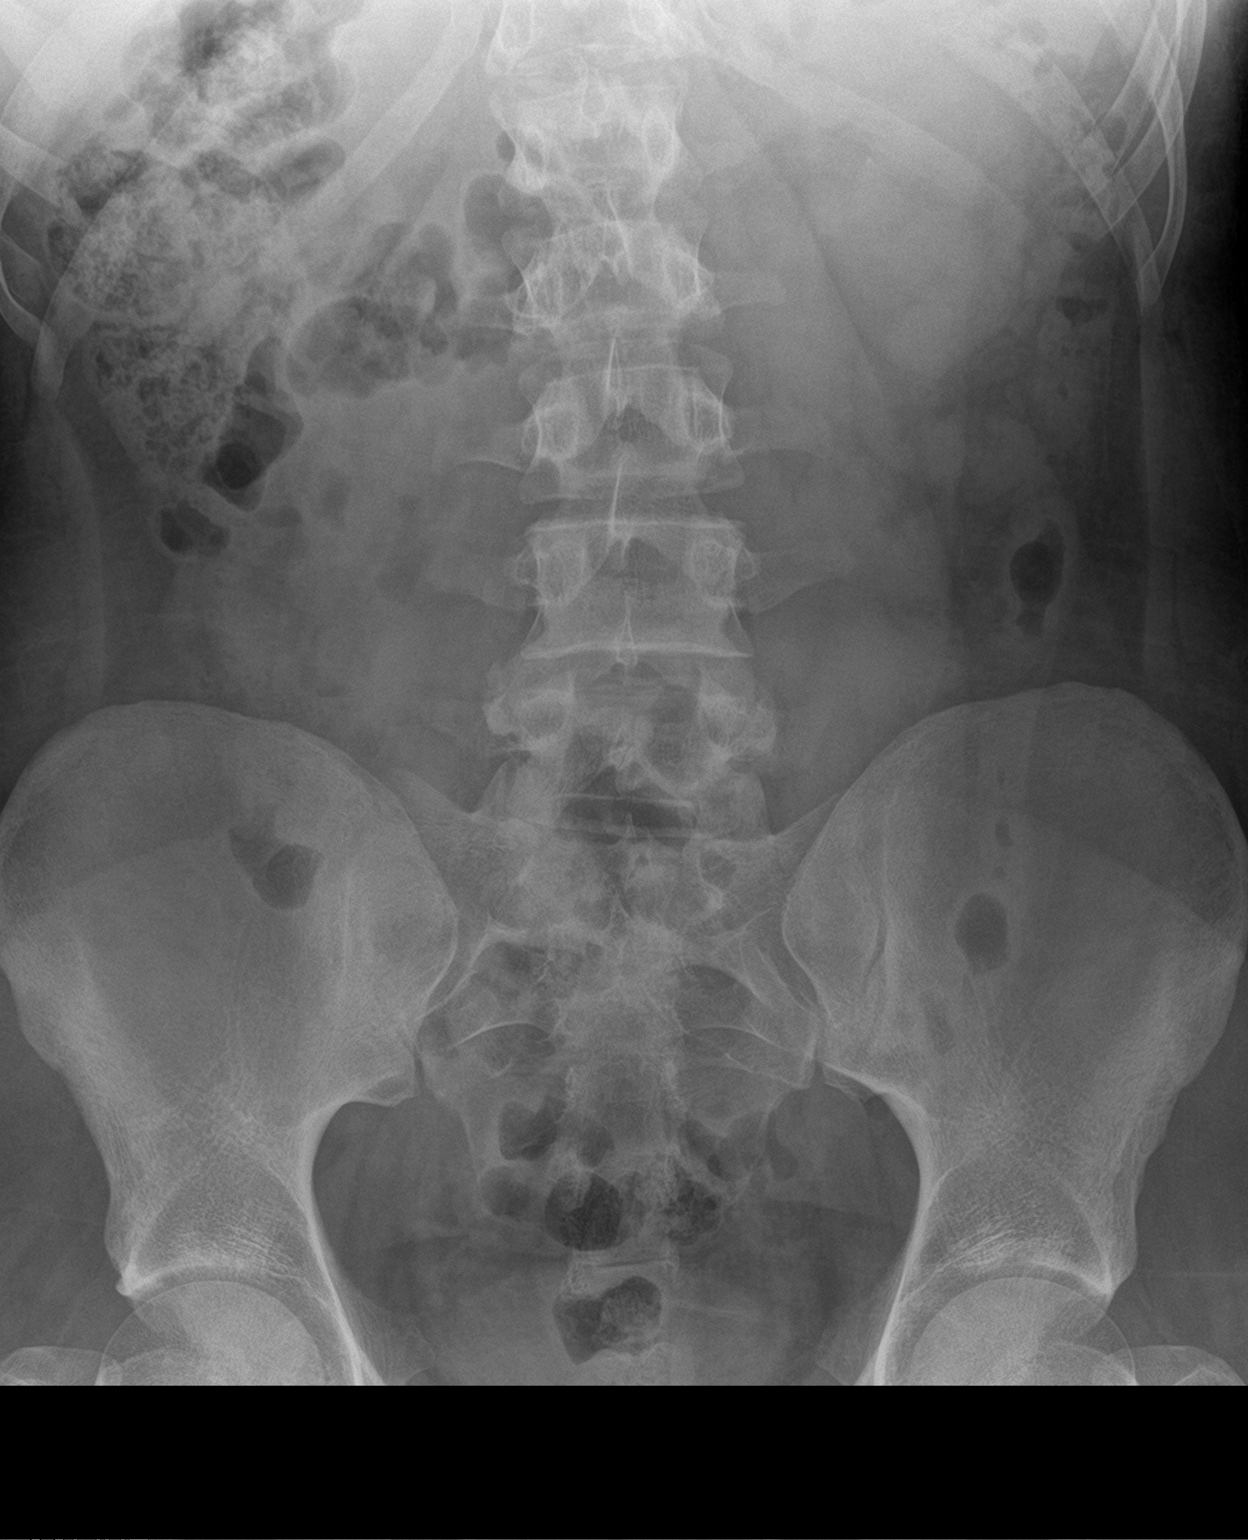

[1 of 1 positions shown; findings below may reference images not displayed]

FINDINGS: The bulky 14 mm distal left ureteral calculus does not persist.
Punctate up to 4 mm left nephrolithiasis seen by CT is faintly
visible today at the left kidney. The right renal shadow is largely
obscured by bowel. Normal abdominal and pelvic visceral contours
elsewhere. Normal visible bowel gas pattern. No acute osseous
abnormality identified.
IMPRESSION: Resolved large distal left ureteral calculus since 02/06/2020.
Additional left nephrolithiasis faintly visible by x-ray.

## 2022-11-30 ENCOUNTER — Ambulatory Visit (INDEPENDENT_AMBULATORY_CARE_PROVIDER_SITE_OTHER): Payer: 59 | Admitting: Adult Health

## 2022-11-30 ENCOUNTER — Encounter: Payer: Self-pay | Admitting: Adult Health

## 2022-11-30 VITALS — HR 110 | Temp 96.9°F | Ht 74.0 in | Wt 265.0 lb

## 2022-11-30 DIAGNOSIS — Z23 Encounter for immunization: Secondary | ICD-10-CM

## 2022-11-30 DIAGNOSIS — J309 Allergic rhinitis, unspecified: Secondary | ICD-10-CM | POA: Diagnosis not present

## 2022-11-30 NOTE — Progress Notes (Signed)
Ronald Little. [place], [place] [address] Phone: [phone] Fax: [phone]   Office Visit Note  Patient Name: Ronald Little  Date of ACZYS:[id number]  Med Rec number [id number]  Date of Service: 11/30/2022  Patient has no known allergies.  Chief Complaint  Patient presents with   Sore Throat     Sore Throat  Associated symptoms include coughing. Pertinent negatives include no diarrhea, ear pain, headaches or vomiting.    Patient is here reporting a few days of chest irritation and sore throat.  He also reports cough, voice change Denies fever, chills, headache or ear pain. He took mucinex cough medication this morning, with mild improvement.  No sick contacts that he is aware of.    Current Medication:  Outpatient Encounter Medications as of 11/30/2022  Medication Sig   amphetamine-dextroamphetamine (ADDERALL) 10 MG tablet Take 10 mg by mouth daily with breakfast.   desvenlafaxine (PRISTIQ) 100 MG 24 hr tablet Take 100 mg by mouth daily.   Risankizumab-rzaa (SKYRIZI PEN [place]) Inject into the skin.   HYDROcodone-acetaminophen (NORCO/VICODIN) 5-325 MG tablet Take 1-2 tablets by mouth every 6 (six) hours as needed for moderate pain. (Patient not taking: Reported on 11/30/2022)   tamsulosin (FLOMAX) 0.4 MG CAPS capsule Take 1 capsule (0.4 mg total) by mouth daily. (Patient not taking: Reported on 11/30/2022)   No facility-administered encounter medications on file as of 11/30/2022.      Medical History: Past Medical History:  Diagnosis Date   Kidney stone    Psoriasis      Vital Signs: Pulse (!) 110   Temp (!) 96.9 F (36.1 C) (Tympanic)   Ht 6\' 2"  (1.88 m)   Wt 265 lb (120.2 kg)   SpO2 97%   BMI 34.02 kg/m    Review of Systems  Constitutional:  Negative for chills and fever.  HENT:  Positive for rhinorrhea and sore throat. Negative for ear pain.   Eyes:  Negative for pain, redness and itching.  Respiratory:  Positive for cough.    Cardiovascular:  Negative for chest pain.  Gastrointestinal:  Negative for diarrhea, nausea and vomiting.  Neurological:  Negative for headaches.    Physical Exam Vitals reviewed.  Constitutional:      Appearance: He is well-developed.  HENT:     Head: Normocephalic.     Right Ear: Tympanic membrane and ear canal normal.     Left Ear: Tympanic membrane and ear canal normal.     Nose: Rhinorrhea present.     Right Turbinates: Pale.     Left Turbinates: Pale.     Mouth/Throat:     Mouth: Mucous membranes are moist.  Pulmonary:     Effort: Pulmonary effort is normal.     Breath sounds: Normal breath sounds.  Lymphadenopathy:     Cervical: No cervical adenopathy.  Neurological:     Mental Status: He is alert.    Assessment/Plan: 1. Allergic rhinitis, unspecified seasonality, unspecified trigger Discussed using Daily allergy medication such as Zyrtec or Claritin. Also instructed patient to use Flonase, Two sprays in each nostril twice daily.  Follow up via MyChart messenger if symptoms fail to improve or may return to clinic as needed for worsening symptoms.    2. Needs flu shot Flu shot given today. - Flu Vaccine QUAD 6+ mos PF IM (Fluarix Quad PF)        General Counseling: [name] verbalizes understanding of the findings of todays visit and agrees with plan of treatment.  I have discussed any further diagnostic evaluation that may be needed or ordered today. We also reviewed his medications today. he has been encouraged to call the office with any questions or concerns that should arise related to todays visit.   Orders Placed This Encounter  Procedures   Flu Vaccine QUAD 6+ mos PF IM (Fluarix Quad PF)    No orders of the defined types were placed in this encounter.   Time spent:20 Minutes Time spent includes review of chart, medications, test results, and follow up plan with the patient.    Kendell Bane AGNP-C Nurse Practitioner

## 2023-02-13 ENCOUNTER — Encounter: Payer: Self-pay | Admitting: Radiology

## 2023-02-13 ENCOUNTER — Other Ambulatory Visit: Payer: Self-pay

## 2023-02-13 ENCOUNTER — Emergency Department
Admission: EM | Admit: 2023-02-13 | Discharge: 2023-02-13 | Disposition: A | Discharge status: Home / Self Care | Payer: 59 | Attending: Emergency Medicine | Admitting: Emergency Medicine

## 2023-02-13 ENCOUNTER — Emergency Department: Payer: 59

## 2023-02-13 DIAGNOSIS — R109 Unspecified abdominal pain: Secondary | ICD-10-CM

## 2023-02-13 DIAGNOSIS — N200 Calculus of kidney: Secondary | ICD-10-CM | POA: Insufficient documentation

## 2023-02-13 LAB — URINALYSIS, ROUTINE W REFLEX MICROSCOPIC
Bilirubin Urine: NEGATIVE
Glucose, UA: NEGATIVE mg/dL
Hgb urine dipstick: NEGATIVE
Ketones, ur: NEGATIVE mg/dL
Leukocytes,Ua: NEGATIVE
Nitrite: NEGATIVE
Protein, ur: NEGATIVE mg/dL
Specific Gravity, Urine: 1.016 (ref 1.005–1.030)
pH: 6 (ref 5.0–8.0)

## 2023-02-13 LAB — CBC
HCT: 45 % (ref 39.0–52.0)
Hemoglobin: 14.5 g/dL (ref 13.0–17.0)
MCH: 27.4 pg (ref 26.0–34.0)
MCHC: 32.2 g/dL (ref 30.0–36.0)
MCV: 84.9 fL (ref 80.0–100.0)
Platelets: 215 10*3/uL (ref 150–400)
RBC: 5.3 MIL/uL (ref 4.22–5.81)
RDW: 12.4 % (ref 11.5–15.5)
WBC: 6.8 10*3/uL (ref 4.0–10.5)
nRBC: 0 % (ref 0.0–0.2)

## 2023-02-13 LAB — BASIC METABOLIC PANEL
Anion gap: 8 (ref 5–15)
BUN: 11 mg/dL (ref 6–20)
CO2: 27 mmol/L (ref 22–32)
Calcium: 9.5 mg/dL (ref 8.9–10.3)
Chloride: 106 mmol/L (ref 98–111)
Creatinine, Ser: 0.74 mg/dL (ref 0.61–1.24)
GFR, Estimated: >60 mL/min (ref >60)
Glucose, Bld: 102 mg/dL — ABNORMAL HIGH (ref 70–99)
Potassium: 4.1 mmol/L (ref 3.5–5.1)
Sodium: 141 mmol/L (ref 135–145)

## 2023-02-13 MED ORDER — IOHEXOL 300 MG/ML  SOLN
100.0000 mL | Freq: Once | INTRAMUSCULAR | Status: AC | PRN
  Administered 2023-02-13: 100 mL via INTRAVENOUS

## 2023-02-13 MED ORDER — ONDANSETRON HCL 4 MG/2ML IJ SOLN: 4.0000 mg | Freq: Once | INTRAMUSCULAR | Status: DC

## 2023-02-13 MED ORDER — SODIUM CHLORIDE 0.9 % IV BOLUS
1000.0000 mL | Freq: Once | INTRAVENOUS | Status: AC
  Administered 2023-02-13: 1000 mL via INTRAVENOUS

## 2023-02-13 MED ORDER — KETOROLAC TROMETHAMINE 15 MG/ML IJ SOLN
15.0000 mg | Freq: Once | INTRAMUSCULAR | Status: AC
  Administered 2023-02-13: 15 mg via INTRAVENOUS
  Filled 2023-02-13: qty 1

## 2023-02-13 MED ORDER — TAMSULOSIN HCL 0.4 MG PO CAPS: 0.4000 mg | ORAL_CAPSULE | Freq: Every day | ORAL | 0 refills | Status: AC

## 2023-02-13 NOTE — Discharge Instructions (Signed)
You have multiple small kidney stones in your left side, and a recently passed stone.  Please follow-up with the urologist.  Please return for any new, worsening, or change in symptoms or other concerns.  It was a pleasure caring for you today.

## 2023-02-13 NOTE — ED Provider Notes (Signed)
[facility] Provider Note    Event Date/Time   First MD Initiated Contact with Patient 02/13/23 1219     (approximate)   History   Flank Pain   HPI  Ronald Little is a 22 y.o. male with a past medical history of kidney stones who presents today for evaluation of intermittent left flank pain.  Patient reports that this has been ongoing for the past several months.  He reports that he does not currently have pain, but reports that the pain comes and goes and feels similar to his previous kidney stone.  He reports that he had significant pain this morning.  He reports that he has had multiple kidney stones in the past.  He has not had any blood in his urine.  No nausea or vomiting.  No fevers or chills.  Patient Active Problem List   Diagnosis Date Noted   Left ureteral stone 02/06/2020          Physical Exam   Triage Vital Signs: ED Triage Vitals  Enc Vitals Group     BP 02/13/23 1137 136/79     Pulse Rate 02/13/23 1137 84     Resp 02/13/23 1137 16     Temp 02/13/23 1137 98.6 F (37 C)     Temp Source 02/13/23 1137 Oral     SpO2 02/13/23 1137 98 %     Weight 02/13/23 1137 240 lb (108.9 kg)     Height 02/13/23 1137  (1.88 m)     Head Circumference --      Peak Flow --      Pain Score 02/13/23 1132 8     Pain Loc --      Pain Edu? --      Excl. in GC? --     Most recent vital signs: Vitals:   02/13/23 1137  BP: 136/79  Pulse: 84  Resp: 16  Temp: 98.6 F (37 C)  SpO2: 98%    Physical Exam Vitals and nursing note reviewed.  Constitutional:      General: Awake and alert. No acute distress.    Appearance: Normal appearance. The patient is overweight.  HENT:     Head: Normocephalic and atraumatic.     Mouth: Mucous membranes are moist.  Eyes:     General: PERRL. Normal EOMs        Right eye: No discharge.        Left eye: No discharge.     Conjunctiva/sclera: Conjunctivae normal.  Cardiovascular:     Rate and Rhythm:  Normal rate and regular rhythm.     Pulses: Normal pulses.  Pulmonary:     Effort: Pulmonary effort is normal. No respiratory distress.     Breath sounds: Normal breath sounds.  Abdominal:     Abdomen is soft. There is mild LLQ abdominal tenderness. No rebound or guarding. No distention. No CVAT Musculoskeletal:        General: No swelling. Normal range of motion.     Cervical back: Normal range of motion and neck supple.  Skin:    General: Skin is warm and dry.     Capillary Refill: Capillary refill takes less than 2 seconds.     Findings: No rash.  Neurological:     Mental Status: The patient is awake and alert.      ED Results / Procedures / Treatments   Labs (all labs ordered are listed, but only abnormal results are displayed) Labs  Reviewed  URINALYSIS, ROUTINE W REFLEX MICROSCOPIC - Abnormal; Notable for the following components:      Result Value   Color, Urine YELLOW (*)    APPearance CLEAR (*)    All other components within normal limits  BASIC METABOLIC PANEL - Abnormal; Notable for the following components:   Glucose, Bld 102 (*)    All other components within normal limits  CBC     EKG     RADIOLOGY I independently reviewed and interpreted imaging and agree with radiologists findings.     PROCEDURES:  Critical Care performed:   Procedures   MEDICATIONS ORDERED IN ED: Medications  sodium chloride 0.9 % bolus 1,000 mL (0 mLs Intravenous Stopped 02/13/23 1339)  ketorolac (TORADOL) 15 MG/ML injection 15 mg (15 mg Intravenous Given 02/13/23 1246)  iohexol (OMNIPAQUE) 300 MG/ML solution 100 mL (100 mLs Intravenous Contrast Given 02/13/23 1306)     IMPRESSION / MDM / ASSESSMENT AND PLAN / ED COURSE  I reviewed the triage vital signs and the nursing notes.   Differential diagnosis includes, but is not limited to, nephrolithiasis, diverticulitis, UTI, ureteral colic.  Patient is awake and alert, hemodynamically stable and afebrile.  He is currently  quite comfortable in appearance.  No rebound or guarding.  No CVA tenderness.  Labs obtained in triage are overall reassuring.  Urinalysis is clean.  Given the waxing and waning nature of his symptoms, CT scan was obtained for further evaluation.  CT reveals left nephrolithiasis without current ureteral calculi.  He does have fullness of the left ureter which may reflect a recently passed calculus.  Upon reevaluation, patient reports that his symptoms have resolved completely after the Toradol and fluids.  He requested a refill of his Flomax which was provided.  Recommended close outpatient follow-up with urology.  Also discussed return precautions.  Patient understands and agrees with plan.  Discharged in stable condition.   Patient's presentation is most consistent with acute complicated illness / injury requiring diagnostic workup.   FINAL CLINICAL IMPRESSION(S) / ED DIAGNOSES   Final diagnoses:  Left flank pain  Nephrolithiasis     Rx / DC Orders   ED Discharge Orders          Ordered    tamsulosin (FLOMAX) 0.4 MG CAPS capsule  Daily        02/13/23 1334             Note:  This document was prepared using Dragon voice recognition software and may include unintentional dictation errors.   Keturah Shavers 02/13/23 1425    Georga Hacking, MD 02/13/23 [phone]

## 2023-02-13 NOTE — ED Triage Notes (Addendum)
Pt comes with c/o flank pain and possible kidney stone. Pt states week of left sided pain. Pt states it got worse today. Pt

## 2024-01-27 ENCOUNTER — Encounter: Payer: Self-pay | Admitting: Emergency Medicine

## 2024-01-27 ENCOUNTER — Other Ambulatory Visit: Payer: Self-pay

## 2024-01-27 ENCOUNTER — Ambulatory Visit
Admission: EM | Admit: 2024-01-27 | Discharge: 2024-01-27 | Disposition: A | Discharge status: Home / Self Care | Attending: Emergency Medicine | Admitting: Emergency Medicine

## 2024-01-27 DIAGNOSIS — J069 Acute upper respiratory infection, unspecified: Secondary | ICD-10-CM | POA: Diagnosis not present

## 2024-01-27 LAB — POC COVID19/FLU A&B COMBO
Covid Antigen, POC: NEGATIVE
Influenza A Antigen, POC: NEGATIVE
Influenza B Antigen, POC: NEGATIVE

## 2024-01-27 MED ORDER — PREDNISONE 10 MG (21) PO TBPK: ORAL_TABLET | Freq: Every day | ORAL | 0 refills | Status: AC

## 2024-01-27 MED ORDER — AMOXICILLIN-POT CLAVULANATE 875-125 MG PO TABS: 1.0000 | ORAL_TABLET | Freq: Two times a day (BID) | ORAL | 0 refills | Status: AC

## 2024-01-27 MED ORDER — IPRATROPIUM BROMIDE 0.03 % NA SOLN: 2.0000 | Freq: Two times a day (BID) | NASAL | 0 refills | Status: AC

## 2024-01-27 NOTE — ED Provider Notes (Signed)
 Ronald Little    CSN: [id number] Arrival date & time: 01/27/24  1446      History   Chief Complaint Chief Complaint  Patient presents with   URI         HPI Ronald Little is a 22 y.o. male.   Presents for evaluation of nasal congestion, rhinorrhea, nonproductive cough, fatigue, malaise, generalized bodyaches, sinus pressure present for 5 days.  Symptoms began after recent travel but no known sick contacts.  Tolerating food and liquids.  Has attempted use of Claritin, Benadryl and nasal spray.  Denies shortness of breath and wheezing.    Past Medical History:  Diagnosis Date   Kidney stone    Psoriasis     Patient Active Problem List   Diagnosis Date Noted   Left ureteral stone 02/06/2020    Past Surgical History:  Procedure Laterality Date   EXTRACORPOREAL SHOCK WAVE LITHOTRIPSY Left 02/06/2020   Procedure: EXTRACORPOREAL SHOCK WAVE LITHOTRIPSY (ESWL);  Surgeon: Vanna Scotland, MD;  Location: [facility] ORS;  Service: Urology;  Laterality: Left;       Home Medications    Prior to Admission medications   Medication Sig Start Date End Date Taking? Authorizing Provider  amoxicillin-clavulanate (AUGMENTIN) 875-125 MG tablet Take 1 tablet by mouth every 12 (twelve) hours. 01/31/24  Yes Katura Eatherly R, NP  ipratropium (ATROVENT) 0.03 % nasal spray Place 2 sprays into both nostrils every 12 (twelve) hours. 01/27/24  Yes Jewel Mcafee R, NP  predniSONE (STERAPRED UNI-PAK 21 TAB) 10 MG (21) TBPK tablet Take by mouth daily. Take 6 tabs by mouth daily  for 1 days, then 5 tabs for 1 days, then 4 tabs for 1 days, then 3 tabs for 1 days, 2 tabs for 1 days, then 1 tab by mouth daily for 1 days 01/27/24  Yes Napoleon Monacelli R, NP  amphetamine-dextroamphetamine (ADDERALL) 10 MG tablet Take 10 mg by mouth daily with breakfast.    [provider]  desvenlafaxine (PRISTIQ) 100 MG 24 hr tablet Take 100 mg by mouth daily.    [provider]   HYDROcodone-acetaminophen (NORCO/VICODIN) 5-325 MG tablet Take 1-2 tablets by mouth every 6 (six) hours as needed for moderate pain. Patient not taking: Reported on 11/30/2022 02/06/20   Vanna Scotland, MD  Risankizumab-rzaa [facility] PEN [place]) Inject into the skin.    [provider]  tamsulosin (FLOMAX) 0.4 MG CAPS capsule Take 1 capsule (0.4 mg total) by mouth daily. Patient not taking: Reported on 11/30/2022 02/06/20   Vanna Scotland, MD  tamsulosin (FLOMAX) 0.4 MG CAPS capsule Take 1 capsule (0.4 mg total) by mouth daily. 02/13/23   Poggi, Herb Grays, PA-C    Family History History reviewed. No pertinent family history.  Social History Social History   Tobacco Use   Smoking status: Never   Smokeless tobacco: Never  Vaping Use   Vaping status: Never Used  Substance Use Topics   Alcohol use: Never   Drug use: Yes    Types: Marijuana     Allergies   Patient has no known allergies.   Review of Systems Review of Systems   Physical Exam Triage Vital Signs ED Triage Vitals  Encounter Vitals Group     BP 01/27/24 1512 (!) 157/92     Systolic BP Percentile --      Diastolic BP Percentile --      Pulse Rate 01/27/24 1512 (!) 101     Resp 01/27/24 1512 18     Temp 01/27/24  1512 99.5 F (37.5 C)     Temp Source 01/27/24 1512 Oral     SpO2 01/27/24 1512 95 %     Weight --      Height --      Head Circumference --      Peak Flow --      Pain Score 01/27/24 1513 5     Pain Loc --      Pain Education --      Exclude from Growth Chart --    No data found.  Updated Vital Signs BP (!) 157/92 (BP Location: Left Arm)   Pulse (!) 101   Temp 99.5 F (37.5 C) (Oral)   Resp 18   SpO2 95%   Visual Acuity Right Eye Distance:   Left Eye Distance:   Bilateral Distance:    Right Eye Near:   Left Eye Near:    Bilateral Near:     Physical Exam Constitutional:      Appearance: Normal appearance.  HENT:     Head: Normocephalic.     Right Ear: Tympanic membrane, ear  canal and external ear normal.     Left Ear: Tympanic membrane, ear canal and external ear normal.     Nose: Congestion present.     Mouth/Throat:     Pharynx: No oropharyngeal exudate or posterior oropharyngeal erythema.     Tonsils: 3+ on the right. 3+ on the left.  Eyes:     Extraocular Movements: Extraocular movements intact.  Cardiovascular:     Rate and Rhythm: Normal rate and regular rhythm.     Pulses: Normal pulses.     Heart sounds: Normal heart sounds.  Pulmonary:     Effort: Pulmonary effort is normal.     Breath sounds: Normal breath sounds.  Neurological:     Mental Status: He is alert and oriented to person, place, and time. Mental status is at baseline.      UC Treatments / Results  Labs (all labs ordered are listed, but only abnormal results are displayed) Labs Reviewed  POC COVID19/FLU A&B COMBO - Normal    EKG   Radiology No results found.  Procedures Procedures (including critical care time)  Medications Ordered in UC Medications - No data to display  Initial Impression / Assessment and Plan / UC Course  I have reviewed the triage vital signs and the nursing notes.  Pertinent labs & imaging results that were available during my care of the patient were reviewed by me and considered in my medical decision making (see chart for details).  Viral URI with cough  Patient is in no signs of distress nor toxic appearing.  Vital signs are stable.  Low suspicion for pneumonia, pneumothorax or bronchitis and therefore will defer imaging.  Flu test negative.  Prescribed prednisone and Atrovent nasal spray, watch wait antibiotic placed at pharmacy.May use additional over-the-counter medications as needed for supportive care.  May follow-up with urgent care as needed if symptoms persist or worsen.  Note given.   Final Clinical Impressions(s) / UC Diagnoses   Final diagnoses:  Viral URI with cough     Discharge Instructions      Your symptoms today are  most likely being caused by a virus and should steadily improve in time it can take up to 7 to 10 days before you truly start to see a turnaround however things will get better, if no improvement seen by Wednesday may pick up Augmentin from the pharmacy  COVID  and flu test negative]  Begin prednisone every morning with food to help reduce sinus pressure  May use nasal spray twice daily for additional comfort      You can take Tylenol and/or Ibuprofen as needed for fever reduction and pain relief.   For cough: honey 1/2 to 1 teaspoon (you can dilute the honey in water or another fluid).  You can also use guaifenesin and dextromethorphan for cough. You can use a humidifier for chest congestion and cough.  If you don't have a humidifier, you can sit in the bathroom with the hot shower running.      For sore throat: try warm salt water gargles, cepacol lozenges, throat spray, warm tea or water with lemon/honey, popsicles or ice, or OTC cold relief medicine for throat discomfort.   For congestion: take a daily anti-histamine like Zyrtec, Claritin, and a oral decongestant, such as pseudoephedrine.  You can also use Flonase 1-2 sprays in each nostril daily.   It is important to stay hydrated: drink plenty of fluids (water, gatorade/powerade/pedialyte, juices, or teas) to keep your throat moisturized and help further relieve irritation/discomfort.    ED Prescriptions     Medication Sig Dispense Auth. Provider   predniSONE (STERAPRED UNI-PAK 21 TAB) 10 MG (21) TBPK tablet Take by mouth daily. Take 6 tabs by mouth daily  for 1 days, then 5 tabs for 1 days, then 4 tabs for 1 days, then 3 tabs for 1 days, 2 tabs for 1 days, then 1 tab by mouth daily for 1 days 21 tablet Elta Angell R, NP   ipratropium (ATROVENT) 0.03 % nasal spray Place 2 sprays into both nostrils every 12 (twelve) hours. 30 mL Mariachristina Holle R, NP   amoxicillin-clavulanate (AUGMENTIN) 875-125 MG tablet Take 1 tablet by mouth  every 12 (twelve) hours. 14 tablet Roanne Haye, Elita Boone, NP      PDMP not reviewed this encounter.   Valinda Hoar, NP 01/27/24 [phone]

## 2024-01-27 NOTE — Discharge Instructions (Signed)
 Your symptoms today are most likely being caused by a virus and should steadily improve in time it can take up to 7 to 10 days before you truly start to see a turnaround however things will get better, if no improvement seen by Wednesday may pick up Augmentin from the pharmacy  COVID and flu test negative]  Begin prednisone every morning with food to help reduce sinus pressure  May use nasal spray twice daily for additional comfort      You can take Tylenol and/or Ibuprofen as needed for fever reduction and pain relief.   For cough: honey 1/2 to 1 teaspoon (you can dilute the honey in water or another fluid).  You can also use guaifenesin and dextromethorphan for cough. You can use a humidifier for chest congestion and cough.  If you don't have a humidifier, you can sit in the bathroom with the hot shower running.      For sore throat: try warm salt water gargles, cepacol lozenges, throat spray, warm tea or water with lemon/honey, popsicles or ice, or OTC cold relief medicine for throat discomfort.   For congestion: take a daily anti-histamine like Zyrtec, Claritin, and a oral decongestant, such as pseudoephedrine.  You can also use Flonase 1-2 sprays in each nostril daily.   It is important to stay hydrated: drink plenty of fluids (water, gatorade/powerade/pedialyte, juices, or teas) to keep your throat moisturized and help further relieve irritation/discomfort.

## 2024-01-27 NOTE — ED Triage Notes (Signed)
 Patient presents to [facility] for evaluation of runny nose, cough, body aches, fatigue, malaise and headache since Monday, worsening through out the week.

## 2024-11-28 ENCOUNTER — Encounter: Payer: Self-pay | Admitting: Medical

## 2024-11-29 ENCOUNTER — Ambulatory Visit: Payer: Self-pay | Admitting: Emergency Medicine

## 2024-11-29 ENCOUNTER — Encounter: Payer: Self-pay | Admitting: Emergency Medicine

## 2024-11-29 VITALS — BP 134/74 | HR 81 | Temp 99.8°F | Ht 74.0 in | Wt 274.0 lb

## 2024-11-29 DIAGNOSIS — L819 Disorder of pigmentation, unspecified: Secondary | ICD-10-CM

## 2024-11-29 DIAGNOSIS — Z23 Encounter for immunization: Secondary | ICD-10-CM

## 2024-11-29 DIAGNOSIS — Z0000 Encounter for general adult medical examination without abnormal findings: Secondary | ICD-10-CM | POA: Diagnosis not present

## 2024-11-29 DIAGNOSIS — Z114 Encounter for screening for human immunodeficiency virus [HIV]: Secondary | ICD-10-CM | POA: Diagnosis not present

## 2024-11-29 DIAGNOSIS — G4700 Insomnia, unspecified: Secondary | ICD-10-CM | POA: Diagnosis not present

## 2024-11-29 DIAGNOSIS — Z1159 Encounter for screening for other viral diseases: Secondary | ICD-10-CM | POA: Diagnosis not present

## 2024-11-29 NOTE — Progress Notes (Signed)
 " [employer]     Chief Complaint   Chief Complaint  Patient presents with   Annual Exam     Patient ID: Ronald Little, male    DOB: 27-Mar-2001  Age: 24 y.o. MRN: [id number]  Here for routine physical  Feeling healthy and well today In his last semester at [employer]  Trying to eat healthier  Drinks a lot of water Trying to exercise more  Smokes marijuana, no nicotine or tobacco Rare alcohol use, less than monthly   Thinks he has chronic insomnia, wakes frequently during the night, and trouble falling asleep. Does not feel rested. He does snore.   Concerns for a darker spot on the left cheek, present for several years or more. Has not changed in size. Saw dermatology for this around onset, no findings.  Also some dry skin behind the right ear, itches every now and then  On Skyrizi for psoriasis, injection every 12 weeks  Thinks he is up to date on immunizations Reports has definitely had Tdap in last 10 years  No flu shot yet this year  Past Medical History    Past Medical History:  Diagnosis Date   Anxiety    Depression    Kidney stone    Psoriasis      Family History  History reviewed. No pertinent family history.   Social History   Social History[1]   Home Medications    Medications Ordered Prior to Encounter[2]   Allergies    Allergies[3]   Review of Systems  ROS  As per HPI  Physical Exam  Vital Signs  Vitals:   11/29/24 1256  BP: 134/74  Pulse: 81  Temp: 99.8 F (37.7 C)  SpO2: 98%     Physical Exam Vitals and nursing note reviewed.  Constitutional:      General: He is not in acute distress.    Appearance: Normal appearance.  HENT:     Right Ear: Tympanic membrane and ear canal normal.     Left Ear: Tympanic membrane and ear canal normal.     Mouth/Throat:     Pharynx: Oropharynx is clear.     Tonsils: 2+ on the right. 2+ on the left.     Comments: Small papule on right tonsil, reports present since childhood Eyes:      Conjunctiva/sclera: Conjunctivae normal.     Pupils: Pupils are equal, round, and reactive to light.  Cardiovascular:     Rate and Rhythm: Normal rate and regular rhythm.     Pulses: Normal pulses.     Heart sounds: Normal heart sounds.  Pulmonary:     Effort: Pulmonary effort is normal.     Breath sounds: Normal breath sounds.  Abdominal:     Palpations: Abdomen is soft.     Tenderness: There is no abdominal tenderness. There is no right CVA tenderness, left CVA tenderness or guarding.  Musculoskeletal:        General: Normal range of motion.     Cervical back: Normal range of motion. No rigidity.  Lymphadenopathy:     Cervical: No cervical adenopathy.  Skin:    General: Skin is warm and dry.     Comments: Small hyperpigmented area of the left cheek, faint almost un noticeable. 0.5 mm size. Scattered nevi   Neurological:     Mental Status: He is alert and oriented to person, place, and time.     Motor: No weakness.     Gait: Gait normal.  Treatments / Results  Labs  Lab Orders         CBC w/Diff         Comprehensive metabolic panel         TSH         HgB A1c         Hepatitis C Antibody         Lipid panel         HIV antibody (with reflex)       No results found for this or any previous visit (from the past 24 hours).   No orders of the defined types were placed in this encounter.    Assessment and Plan  Pertinent lab results that were available during my care of the patient were reviewed by me and considered in my medical decision making (see chart for details).  Good vitals  Physical labs pending today CBC, CMP, TSH, A1c Patient would also like lipid panel, which is pending. Per chart review he is overdue for HIV and Hep C screening, he's not sure if he ever had these done back home in [place] . Pending.   He stated his health goals are eating better and exercising more. Will work on these over his final semester at college.   Flu shot given  today  Dermatology referral placed for his concerns of hyperpigmentation and dry skin. Understands derm may be booked out months.  Provided with [facility] sleep clinic information for his insomnia concerns, possible sleep apnea  Have also provided with local psychology and psychiatry clinics  Can follow up with me for any concerns. Patient agreeable with this plan, questions answered     Asberry Searle, PA [facility]     [1]  Social History Tobacco Use   Smoking status: Never   Smokeless tobacco: Never  Vaping Use   Vaping status: Never Used  Substance Use Topics   Alcohol use: Not Currently    Comment: rare   Drug use: Yes    Types: Marijuana  [2]  Current Outpatient Medications on File Prior to Visit  Medication Sig Dispense Refill   amphetamine-dextroamphetamine (ADDERALL) 10 MG tablet Take 10 mg by mouth daily with breakfast.     desvenlafaxine (PRISTIQ) 100 MG 24 hr tablet Take 100 mg by mouth daily.     Risankizumab-rzaa (SKYRIZI PEN [place]) Inject into the skin.     amoxicillin -clavulanate (AUGMENTIN ) 875-125 MG tablet Take 1 tablet by mouth every 12 (twelve) hours. (Patient not taking: Reported on 11/29/2024) 14 tablet 0   HYDROcodone -acetaminophen  (NORCO/VICODIN) 5-325 MG tablet Take 1-2 tablets by mouth every 6 (six) hours as needed for moderate pain. (Patient not taking: Reported on 11/29/2024) 10 tablet 0   ipratropium (ATROVENT ) 0.03 % nasal spray Place 2 sprays into both nostrils every 12 (twelve) hours. (Patient not taking: Reported on 11/29/2024) 30 mL 0   predniSONE  (STERAPRED UNI-PAK 21 TAB) 10 MG (21) TBPK tablet Take by mouth daily. Take 6 tabs by mouth daily  for 1 days, then 5 tabs for 1 days, then 4 tabs for 1 days, then 3 tabs for 1 days, 2 tabs for 1 days, then 1 tab by mouth daily for 1 days (Patient not taking: Reported on 11/29/2024) 21 tablet 0   tamsulosin  (FLOMAX ) 0.4 MG CAPS capsule Take 1 capsule (0.4 mg total) by mouth daily. (Patient not  taking: Reported on 11/29/2024) 30 capsule 0   tamsulosin  (FLOMAX ) 0.4 MG CAPS capsule Take 1 capsule (0.4 mg  total) by mouth daily. (Patient not taking: Reported on 11/29/2024) 10 capsule 0   No current facility-administered medications on file prior to visit.  [3] No Known Allergies  "

## 2024-11-29 NOTE — Patient Instructions (Addendum)
 [facility] [phone]  [facility] [address] [place], [place] [address]

## 2024-11-30 LAB — CBC WITH DIFFERENTIAL/PLATELET
Basophils Absolute: 0.1 10*3/uL (ref 0.0–0.2)
Basos: 1 %
EOS (ABSOLUTE): 0.4 10*3/uL (ref 0.0–0.4)
Eos: 4 %
Hematocrit: 44.9 % (ref 37.5–51.0)
Hemoglobin: 14.6 g/dL (ref 13.0–17.7)
Immature Grans (Abs): 0 10*3/uL (ref 0.0–0.1)
Immature Granulocytes: 0 %
Lymphocytes Absolute: 2.6 10*3/uL (ref 0.7–3.1)
Lymphs: 31 %
MCH: 28.2 pg (ref 26.6–33.0)
MCHC: 32.5 g/dL (ref 31.5–35.7)
MCV: 87 fL (ref 79–97)
Monocytes Absolute: 0.7 10*3/uL (ref 0.1–0.9)
Monocytes: 8 %
Neutrophils Absolute: 4.7 10*3/uL (ref 1.4–7.0)
Neutrophils: 56 %
Platelets: 227 10*3/uL (ref 150–450)
RBC: 5.17 x10E6/uL (ref 4.14–5.80)
RDW: 11.9 % (ref 11.6–15.4)
WBC: 8.5 10*3/uL (ref 3.4–10.8)

## 2024-11-30 LAB — HEMOGLOBIN A1C
Est. average glucose Bld gHb Est-mCnc: 108 mg/dL
Hgb A1c MFr Bld: 5.4 % (ref 4.8–5.6)

## 2024-11-30 LAB — HIV ANTIBODY (ROUTINE TESTING W REFLEX): HIV Screen 4th Generation wRfx: NONREACTIVE

## 2024-12-02 LAB — COMPREHENSIVE METABOLIC PANEL WITH GFR
ALT: 40 [IU]/L
AST: 25 [IU]/L (ref 0–40)
Albumin: 4.8 g/dL
Alkaline Phosphatase: 76 [IU]/L (ref 48–129)
BUN/Creatinine Ratio: 16
BUN: 15 mg/dL
Bilirubin Total: 0.2 mg/dL (ref 0.0–1.2)
CO2: 25 mmol/L (ref 20–29)
Calcium: 10.1 mg/dL
Chloride: 102 mmol/L (ref 96–106)
Creatinine, Ser: 0.96 mg/dL
Globulin, Total: 2.8 g/dL (ref 1.5–4.5)
Glucose: 84 mg/dL (ref 70–99)
Potassium: 4.5 mmol/L (ref 3.5–5.2)
Sodium: 141 mmol/L (ref 134–144)
Total Protein: 7.6 g/dL (ref 6.0–8.5)

## 2024-12-02 LAB — LIPID PANEL W/O CHOL/HDL RATIO
Cholesterol, Total: 142 mg/dL (ref 100–199)
HDL: 34 mg/dL — ABNORMAL LOW
LDL Chol Calc (NIH): 84 mg/dL (ref 0–99)
Triglycerides: 131 mg/dL (ref 0–149)
VLDL Cholesterol Cal: 24 mg/dL (ref 5–40)

## 2024-12-03 ENCOUNTER — Ambulatory Visit: Payer: Self-pay | Admitting: Emergency Medicine

## 2024-12-03 LAB — SPECIMEN STATUS REPORT

## 2024-12-03 LAB — TSH: TSH: 1.75 u[IU]/mL (ref 0.450–4.500)

## 2024-12-03 LAB — HEPATITIS C ANTIBODY: Hep C Virus Ab: NONREACTIVE

## 2024-12-11 ENCOUNTER — Ambulatory Visit
